# Patient Record
Sex: Male | Born: 1950 | Race: White | Hispanic: No | Marital: Married | Smoking: Current every day smoker
Health system: Southern US, Community
[De-identification: ages and names within clinical notes are randomized; demographics above are authoritative.]

## PROBLEM LIST (undated history)

## (undated) DIAGNOSIS — G4730 Sleep apnea, unspecified: Secondary | ICD-10-CM

## (undated) HISTORY — PX: COLONOSCOPY: SHX174

---

## 2006-04-04 ENCOUNTER — Ambulatory Visit: Payer: Self-pay | Admitting: Unknown Physician Specialty

## 2009-09-26 ENCOUNTER — Ambulatory Visit: Payer: Self-pay | Admitting: Podiatry

## 2012-06-17 DIAGNOSIS — Z87898 Personal history of other specified conditions: Secondary | ICD-10-CM

## 2012-06-17 HISTORY — DX: Personal history of other specified conditions: Z87.898

## 2014-06-17 DIAGNOSIS — N401 Enlarged prostate with lower urinary tract symptoms: Secondary | ICD-10-CM

## 2014-06-17 HISTORY — DX: Benign prostatic hyperplasia with lower urinary tract symptoms: N40.1

## 2016-05-30 ENCOUNTER — Other Ambulatory Visit: Payer: Self-pay | Admitting: Unknown Physician Specialty

## 2016-05-30 DIAGNOSIS — Q438 Other specified congenital malformations of intestine: Secondary | ICD-10-CM

## 2016-06-13 ENCOUNTER — Ambulatory Visit
Admission: RE | Admit: 2016-06-13 | Discharge: 2016-06-13 | Disposition: A | Discharge status: Home / Self Care | Payer: BC Managed Care – PPO | Source: Ambulatory Visit | Attending: Unknown Physician Specialty | Admitting: Unknown Physician Specialty

## 2016-06-13 DIAGNOSIS — Q438 Other specified congenital malformations of intestine: Secondary | ICD-10-CM

## 2018-11-06 ENCOUNTER — Other Ambulatory Visit: Payer: Self-pay | Admitting: Internal Medicine

## 2018-11-06 DIAGNOSIS — G9349 Other encephalopathy: Secondary | ICD-10-CM

## 2018-11-19 ENCOUNTER — Ambulatory Visit
Admission: RE | Admit: 2018-11-19 | Discharge: 2018-11-19 | Disposition: A | Discharge status: Home / Self Care | Payer: BC Managed Care – PPO | Source: Ambulatory Visit | Attending: Internal Medicine | Admitting: Internal Medicine

## 2018-11-19 ENCOUNTER — Other Ambulatory Visit: Payer: Self-pay

## 2018-11-19 DIAGNOSIS — G9349 Other encephalopathy: Secondary | ICD-10-CM | POA: Insufficient documentation

## 2018-11-19 MED ORDER — GADOBUTROL 1 MMOL/ML IV SOLN
7.0000 mL | Freq: Once | INTRAVENOUS | Status: AC | PRN
  Administered 2018-11-19: 7 mL via INTRAVENOUS

## 2019-04-18 DIAGNOSIS — G3184 Mild cognitive impairment, so stated: Secondary | ICD-10-CM

## 2019-04-18 HISTORY — DX: Mild cognitive impairment of uncertain or unknown etiology: G31.84

## 2020-07-26 ENCOUNTER — Other Ambulatory Visit: Payer: Self-pay | Admitting: Orthopedic Surgery

## 2020-07-26 DIAGNOSIS — M25311 Other instability, right shoulder: Secondary | ICD-10-CM

## 2020-07-26 DIAGNOSIS — G8929 Other chronic pain: Secondary | ICD-10-CM

## 2020-07-26 DIAGNOSIS — S46001A Unspecified injury of muscle(s) and tendon(s) of the rotator cuff of right shoulder, initial encounter: Secondary | ICD-10-CM

## 2020-08-02 ENCOUNTER — Ambulatory Visit
Admission: RE | Admit: 2020-08-02 | Discharge: 2020-08-02 | Disposition: A | Discharge status: Home / Self Care | Payer: BC Managed Care – PPO | Source: Ambulatory Visit | Attending: Orthopedic Surgery | Admitting: Orthopedic Surgery

## 2020-08-02 ENCOUNTER — Other Ambulatory Visit: Payer: Self-pay

## 2020-08-02 DIAGNOSIS — M25511 Pain in right shoulder: Secondary | ICD-10-CM | POA: Insufficient documentation

## 2020-08-02 DIAGNOSIS — G8929 Other chronic pain: Secondary | ICD-10-CM | POA: Diagnosis present

## 2020-08-02 DIAGNOSIS — S46001A Unspecified injury of muscle(s) and tendon(s) of the rotator cuff of right shoulder, initial encounter: Secondary | ICD-10-CM | POA: Diagnosis present

## 2020-08-02 DIAGNOSIS — M25311 Other instability, right shoulder: Secondary | ICD-10-CM | POA: Insufficient documentation

## 2020-08-16 ENCOUNTER — Other Ambulatory Visit: Payer: Self-pay | Admitting: Orthopedic Surgery

## 2020-08-23 ENCOUNTER — Other Ambulatory Visit: Payer: Self-pay

## 2020-08-23 ENCOUNTER — Encounter
Admission: RE | Admit: 2020-08-23 | Discharge: 2020-08-23 | Disposition: A | Discharge status: Home / Self Care | Payer: BC Managed Care – PPO | Source: Ambulatory Visit | Attending: Orthopedic Surgery | Admitting: Orthopedic Surgery

## 2020-08-23 HISTORY — DX: Sleep apnea, unspecified: G47.30

## 2020-08-23 NOTE — Patient Instructions (Addendum)
Your procedure is scheduled on:  Monday, March 14 Report to the Registration Desk on the 1st floor of the [facility] To find out your arrival time, please call [phone] between 1PM - 3PM on: Friday, March 11  REMEMBER: Instructions that are not followed completely may result in serious medical risk, up to and including death; or upon the discretion of your surgeon and anesthesiologist your surgery may need to be rescheduled.  Do not eat food after midnight the night before surgery.  No gum chewing, lozengers or hard candies.  You may however, drink CLEAR liquids up to 2 hours before you are scheduled to arrive for your surgery. Do not drink anything within 2 hours of your scheduled arrival time.  Clear liquids include: - water  - apple juice without pulp - gatorade (not RED, PURPLE, OR BLUE) - black coffee or tea (Do NOT add milk or creamers to the coffee or tea) Do NOT drink anything that is not on this list.  In addition, your doctor has ordered for you to drink the provided  Ensure Pre-Surgery Clear Carbohydrate Drink  Drinking this carbohydrate drink up to two hours before surgery helps to reduce insulin resistance and improve patient outcomes. Please complete drinking 2 hours prior to scheduled arrival time.  TAKE THESE MEDICATIONS THE MORNING OF SURGERY WITH A SIP OF WATER:  1.  Finasteride 2.  Sertraline (Zoloft) 3.  Tramadol if needed for pain  One week prior to surgery: starting now, March 9 Stop MELOXICAM, Anti-inflammatories (NSAIDS) such as Advil, Aleve, Ibuprofen, Motrin, Naproxen, Naprosyn and Aspirin based products such as Excedrin, Goodys Powder, BC Powder. Stop ANY OVER THE COUNTER supplements until after surgery. Fish oil  No Alcohol for 24 hours before or after surgery.  No Smoking including e-cigarettes for 24 hours prior to surgery.  No chewable tobacco products for at least 6 hours prior to surgery.  No nicotine patches on the day of surgery.  Do  not use any "recreational" drugs for at least a week prior to your surgery.  Please be advised that the combination of cocaine and anesthesia may have negative outcomes, up to and including death. If you test positive for cocaine, your surgery will be cancelled.  On the morning of surgery brush your teeth with toothpaste and water, you may rinse your mouth with mouthwash if you wish. Do not swallow any toothpaste or mouthwash.  Do not wear jewelry, make-up, hairpins, clips or nail polish.  Do not wear lotions, powders, or perfumes.   Do not shave body from the neck down 48 hours prior to surgery just in case you cut yourself which could leave a site for infection.  Also, freshly shaved skin may become irritated if using the CHG soap.  Contact lenses, hearing aids and dentures may not be worn into surgery.  Do not bring valuables to the hospital. [facility] is not responsible for any missing/lost belongings or valuables.   Use CHG Soap or wipes as directed on instruction sheet.  Bring your C-PAP to the hospital with you in case you may have to spend the night.   Notify your doctor if there is any change in your medical condition (cold, fever, infection).  Wear comfortable clothing (specific to your surgery type) to the hospital.  Plan for stool softeners for home use; pain medications have a tendency to cause constipation. You can also help prevent constipation by eating foods high in fiber such as fruits and vegetables and drinking plenty  of fluids as your diet allows.  After surgery, you can help prevent lung complications by doing breathing exercises.  Take deep breaths and cough every 1-2 hours. Your doctor may order a device called an Incentive Spirometer to help you take deep breaths.  If you are being discharged the day of surgery, you will not be allowed to drive home. You will need a responsible adult (18 years or older) to drive you home and stay with you that night.   If  you are taking public transportation, you will need to have a responsible adult (18 years or older) with you. Please confirm with your physician that it is acceptable to use public transportation.   Please call the Pre-admissions Testing Dept. at [phone] if you have any questions about these instructions.  Surgery Visitation Policy:  Patients undergoing a surgery or procedure may have one family member or support person with them as long as that person is not COVID-19 positive or experiencing its symptoms.  That person may remain in the waiting area during the procedure.

## 2020-08-24 ENCOUNTER — Other Ambulatory Visit: Payer: Self-pay

## 2020-08-24 ENCOUNTER — Encounter
Admission: RE | Admit: 2020-08-24 | Discharge: 2020-08-24 | Disposition: A | Discharge status: Home / Self Care | Payer: BC Managed Care – PPO | Source: Ambulatory Visit | Attending: Orthopedic Surgery | Admitting: Orthopedic Surgery

## 2020-08-24 DIAGNOSIS — R54 Age-related physical debility: Secondary | ICD-10-CM | POA: Insufficient documentation

## 2020-08-24 DIAGNOSIS — Z01810 Encounter for preprocedural cardiovascular examination: Secondary | ICD-10-CM | POA: Insufficient documentation

## 2020-08-24 NOTE — Progress Notes (Signed)
EKG performed today, reviewed by Dr. Noralyn Pick (anesthesia). No previous EKG's on file to compare. Patient having no cardiac history and no cardiac symptoms presently. Ok to proceed with upcoming surgery next week.

## 2020-08-25 ENCOUNTER — Other Ambulatory Visit: Payer: Self-pay

## 2020-08-25 ENCOUNTER — Other Ambulatory Visit
Admission: RE | Admit: 2020-08-25 | Discharge: 2020-08-25 | Disposition: A | Discharge status: Home / Self Care | Payer: BC Managed Care – PPO | Source: Ambulatory Visit | Attending: Orthopedic Surgery | Admitting: Orthopedic Surgery

## 2020-08-25 DIAGNOSIS — Z20822 Contact with and (suspected) exposure to covid-19: Secondary | ICD-10-CM | POA: Insufficient documentation

## 2020-08-25 DIAGNOSIS — M75122 Complete rotator cuff tear or rupture of left shoulder, not specified as traumatic: Secondary | ICD-10-CM | POA: Diagnosis not present

## 2020-08-25 DIAGNOSIS — M75102 Unspecified rotator cuff tear or rupture of left shoulder, not specified as traumatic: Secondary | ICD-10-CM | POA: Diagnosis present

## 2020-08-25 DIAGNOSIS — M25812 Other specified joint disorders, left shoulder: Secondary | ICD-10-CM | POA: Diagnosis not present

## 2020-08-25 DIAGNOSIS — Z01812 Encounter for preprocedural laboratory examination: Secondary | ICD-10-CM | POA: Insufficient documentation

## 2020-08-25 LAB — SARS CORONAVIRUS 2 (TAT 6-24 HRS): SARS Coronavirus 2: NEGATIVE

## 2020-08-28 ENCOUNTER — Ambulatory Visit: Payer: BC Managed Care – PPO | Admitting: Anesthesiology

## 2020-08-28 ENCOUNTER — Encounter: Payer: Self-pay | Admitting: Orthopedic Surgery

## 2020-08-28 ENCOUNTER — Encounter
Admission: RE | Disposition: A | Discharge status: Home / Self Care | Payer: Self-pay | Source: Home / Self Care | Attending: Orthopedic Surgery

## 2020-08-28 ENCOUNTER — Ambulatory Visit
Admission: RE | Admit: 2020-08-28 | Discharge: 2020-08-28 | Disposition: A | Discharge status: Home / Self Care | Payer: BC Managed Care – PPO | Attending: Orthopedic Surgery | Admitting: Orthopedic Surgery

## 2020-08-28 ENCOUNTER — Ambulatory Visit: Payer: BC Managed Care – PPO

## 2020-08-28 DIAGNOSIS — Z20822 Contact with and (suspected) exposure to covid-19: Secondary | ICD-10-CM | POA: Insufficient documentation

## 2020-08-28 DIAGNOSIS — M75122 Complete rotator cuff tear or rupture of left shoulder, not specified as traumatic: Secondary | ICD-10-CM | POA: Insufficient documentation

## 2020-08-28 DIAGNOSIS — Z419 Encounter for procedure for purposes other than remedying health state, unspecified: Secondary | ICD-10-CM

## 2020-08-28 DIAGNOSIS — M25812 Other specified joint disorders, left shoulder: Secondary | ICD-10-CM | POA: Insufficient documentation

## 2020-08-28 DIAGNOSIS — M25519 Pain in unspecified shoulder: Secondary | ICD-10-CM

## 2020-08-28 HISTORY — PX: SHOULDER ARTHROSCOPY WITH SUBACROMIAL DECOMPRESSION AND OPEN ROTATOR C: SHX5688

## 2020-08-28 SURGERY — SHOULDER ARTHROSCOPY WITH SUBACROMIAL DECOMPRESSION AND OPEN ROTATOR CUFF REPAIR, OPEN BICEPS TENDON REPAIR
Anesthesia: General | Laterality: Right

## 2020-08-28 MED ORDER — ORAL CARE MOUTH RINSE: 15.0000 mL | Freq: Once | OROMUCOSAL | Status: AC

## 2020-08-28 MED ORDER — BUPIVACAINE HCL (PF) 0.5 % IJ SOLN
INTRAMUSCULAR | Status: DC | PRN
  Administered 2020-08-28: 10 mL via PERINEURAL

## 2020-08-28 MED ORDER — ACETAMINOPHEN 10 MG/ML IV SOLN
INTRAVENOUS | Status: DC | PRN
  Administered 2020-08-28: 1000 mg via INTRAVENOUS

## 2020-08-28 MED ORDER — BUPIVACAINE HCL (PF) 0.5 % IJ SOLN
INTRAMUSCULAR | Status: AC
  Filled 2020-08-28: qty 10

## 2020-08-28 MED ORDER — DEXAMETHASONE SODIUM PHOSPHATE 10 MG/ML IJ SOLN
INTRAMUSCULAR | Status: DC | PRN
  Administered 2020-08-28: 5 mg via INTRAVENOUS

## 2020-08-28 MED ORDER — ACETAMINOPHEN 10 MG/ML IV SOLN
INTRAVENOUS | Status: AC
  Filled 2020-08-28: qty 100

## 2020-08-28 MED ORDER — LIDOCAINE HCL (CARDIAC) PF 100 MG/5ML IV SOSY
PREFILLED_SYRINGE | INTRAVENOUS | Status: DC | PRN
  Administered 2020-08-28: 100 mg via INTRAVENOUS

## 2020-08-28 MED ORDER — BUPIVACAINE LIPOSOME 1.3 % IJ SUSP
INTRAMUSCULAR | Status: DC | PRN
  Administered 2020-08-28: 20 mL via PERINEURAL

## 2020-08-28 MED ORDER — CHLORHEXIDINE GLUCONATE 0.12 % MT SOLN
OROMUCOSAL | Status: AC
  Filled 2020-08-28: qty 15

## 2020-08-28 MED ORDER — FENTANYL CITRATE (PF) 100 MCG/2ML IJ SOLN
25.0000 ug | INTRAMUSCULAR | Status: DC | PRN
Start: 2020-08-28 — End: 2020-08-28

## 2020-08-28 MED ORDER — FAMOTIDINE 20 MG PO TABS
20.0000 mg | ORAL_TABLET | Freq: Once | ORAL | Status: AC
  Administered 2020-08-28: 20 mg via ORAL

## 2020-08-28 MED ORDER — LACTATED RINGERS IV SOLN: INTRAVENOUS | Status: DC

## 2020-08-28 MED ORDER — ASPIRIN EC 325 MG PO TBEC: 325.0000 mg | DELAYED_RELEASE_TABLET | Freq: Every day | ORAL | 0 refills | Status: AC

## 2020-08-28 MED ORDER — LIDOCAINE HCL (PF) 1 % IJ SOLN
INTRAMUSCULAR | Status: DC | PRN
  Administered 2020-08-28: 3 mL via SUBCUTANEOUS

## 2020-08-28 MED ORDER — PROPOFOL 10 MG/ML IV BOLUS
INTRAVENOUS | Status: AC
  Filled 2020-08-28: qty 20

## 2020-08-28 MED ORDER — MIDAZOLAM HCL 2 MG/2ML IJ SOLN: 2.0000 mg | Freq: Once | INTRAMUSCULAR | Status: AC

## 2020-08-28 MED ORDER — LIDOCAINE HCL (PF) 1 % IJ SOLN
INTRAMUSCULAR | Status: AC
  Filled 2020-08-28: qty 5

## 2020-08-28 MED ORDER — FAMOTIDINE 20 MG PO TABS
ORAL_TABLET | ORAL | Status: AC
  Filled 2020-08-28: qty 1

## 2020-08-28 MED ORDER — EPHEDRINE SULFATE 50 MG/ML IJ SOLN
INTRAMUSCULAR | Status: DC | PRN
  Administered 2020-08-28: 10 mg via INTRAVENOUS

## 2020-08-28 MED ORDER — SUGAMMADEX SODIUM 200 MG/2ML IV SOLN
INTRAVENOUS | Status: DC | PRN
  Administered 2020-08-28: 200 mg via INTRAVENOUS

## 2020-08-28 MED ORDER — PROPOFOL 10 MG/ML IV BOLUS
INTRAVENOUS | Status: DC | PRN
  Administered 2020-08-28: 140 mg via INTRAVENOUS

## 2020-08-28 MED ORDER — FENTANYL CITRATE (PF) 100 MCG/2ML IJ SOLN
INTRAMUSCULAR | Status: AC
  Filled 2020-08-28: qty 2

## 2020-08-28 MED ORDER — MIDAZOLAM HCL 2 MG/2ML IJ SOLN
INTRAMUSCULAR | Status: AC
  Filled 2020-08-28: qty 2

## 2020-08-28 MED ORDER — ACETAMINOPHEN 500 MG PO TABS: 1000.0000 mg | ORAL_TABLET | Freq: Three times a day (TID) | ORAL | 2 refills | Status: AC

## 2020-08-28 MED ORDER — ROCURONIUM BROMIDE 100 MG/10ML IV SOLN
INTRAVENOUS | Status: DC | PRN
  Administered 2020-08-28: 10 mg via INTRAVENOUS
  Administered 2020-08-28: 40 mg via INTRAVENOUS
  Administered 2020-08-28: 10 mg via INTRAVENOUS

## 2020-08-28 MED ORDER — CEFAZOLIN SODIUM-DEXTROSE 2-4 GM/100ML-% IV SOLN
2.0000 g | INTRAVENOUS | Status: AC
  Administered 2020-08-28: 2 g via INTRAVENOUS
  Filled 2020-08-28 (×2): qty 100

## 2020-08-28 MED ORDER — CHLORHEXIDINE GLUCONATE 0.12 % MT SOLN
15.0000 mL | Freq: Once | OROMUCOSAL | Status: AC
  Administered 2020-08-28: 15 mL via OROMUCOSAL

## 2020-08-28 MED ORDER — ONDANSETRON HCL 4 MG/2ML IJ SOLN
4.0000 mg | Freq: Once | INTRAMUSCULAR | Status: AC | PRN
  Administered 2020-08-28: 4 mg via INTRAVENOUS

## 2020-08-28 MED ORDER — EPHEDRINE 5 MG/ML INJ
INTRAVENOUS | Status: AC
  Filled 2020-08-28: qty 10

## 2020-08-28 MED ORDER — FENTANYL CITRATE (PF) 100 MCG/2ML IJ SOLN
INTRAMUSCULAR | Status: DC | PRN
  Administered 2020-08-28: 50 ug via INTRAVENOUS
  Administered 2020-08-28: 25 ug via INTRAVENOUS

## 2020-08-28 MED ORDER — ONDANSETRON HCL 4 MG/2ML IJ SOLN
INTRAMUSCULAR | Status: DC | PRN
  Administered 2020-08-28: 4 mg via INTRAVENOUS

## 2020-08-28 MED ORDER — MIDAZOLAM HCL 2 MG/2ML IJ SOLN
INTRAMUSCULAR | Status: AC
  Administered 2020-08-28: 1.5 mg via INTRAVENOUS
  Filled 2020-08-28: qty 2

## 2020-08-28 MED ORDER — OXYCODONE HCL 5 MG PO TABS: 5.0000 mg | ORAL_TABLET | ORAL | 0 refills | Status: AC | PRN

## 2020-08-28 MED ORDER — SUCCINYLCHOLINE CHLORIDE 20 MG/ML IJ SOLN
INTRAMUSCULAR | Status: DC | PRN
  Administered 2020-08-28: 120 mg via INTRAVENOUS

## 2020-08-28 MED ORDER — ONDANSETRON 4 MG PO TBDP: 4.0000 mg | ORAL_TABLET | Freq: Three times a day (TID) | ORAL | 0 refills | Status: AC | PRN

## 2020-08-28 MED ORDER — BUPIVACAINE LIPOSOME 1.3 % IJ SUSP
INTRAMUSCULAR | Status: AC
  Filled 2020-08-28: qty 20

## 2020-08-28 SURGICAL SUPPLY — 81 items
ADAPTER IRRIG TUBE 2 SPIKE SOL (ADAPTER) ×4 IMPLANT
ADH SKN CLS APL DERMABOND .7 (GAUZE/BANDAGES/DRESSINGS) ×1
ADPR TBG 2 SPK PMP STRL ASCP (ADAPTER) ×2
ANCH SUT 2 SWLK 19.1 CLS EYLT (Anchor) ×1 IMPLANT
ANCH SUT 2X2.3 TAPE (Anchor) ×1 IMPLANT
ANCHOR 2.3 SP SGL 1.2 XBRAID (Anchor) ×2 IMPLANT
ANCHOR SWIVELOCK BIO 4.75X19.1 (Anchor) ×2 IMPLANT
APL PRP STRL LF DISP 70% ISPRP (MISCELLANEOUS) ×1
BNDG ADH 2 X3.75 FABRIC TAN LF (GAUZE/BANDAGES/DRESSINGS) ×2 IMPLANT
BNDG ADH XL 3.75X2 STRCH LF (GAUZE/BANDAGES/DRESSINGS) ×1
BUR BR 5.5 12 FLUTE (BURR) ×2 IMPLANT
BUR RADIUS 4.0X18.5 (BURR) ×2 IMPLANT
CANNULA PART THRD DISP 5.75X7 (CANNULA) IMPLANT
CANNULA PARTIAL THREAD 2X7 (CANNULA) IMPLANT
CANNULA TWIST IN 8.25X9CM (CANNULA) IMPLANT
CHLORAPREP W/TINT 26 (MISCELLANEOUS) ×2 IMPLANT
COOLER POLAR GLACIER W/PUMP (MISCELLANEOUS) ×2 IMPLANT
COVER WAND RF STERILE (DRAPES) ×2 IMPLANT
DERMABOND ADVANCED (GAUZE/BANDAGES/DRESSINGS) ×1
DERMABOND ADVANCED .7 DNX12 (GAUZE/BANDAGES/DRESSINGS) ×1 IMPLANT
DRAPE 3/4 80X56 (DRAPES) ×2 IMPLANT
DRAPE IMP U-DRAPE 54X76 (DRAPES) ×4 IMPLANT
DRAPE INCISE IOBAN 66X45 STRL (DRAPES) ×2 IMPLANT
DRAPE U-SHAPE 47X51 STRL (DRAPES) ×4 IMPLANT
DRSG PAD ABDOMINAL 8X10 ST (GAUZE/BANDAGES/DRESSINGS) ×2 IMPLANT
DRSG TEGADERM 4X4.75 (GAUZE/BANDAGES/DRESSINGS) ×2 IMPLANT
ELECT REM PT RETURN 9FT ADLT (ELECTROSURGICAL) ×2
ELECTRODE REM PT RTRN 9FT ADLT (ELECTROSURGICAL) ×1 IMPLANT
GAUZE SPONGE 4X4 12PLY STRL (GAUZE/BANDAGES/DRESSINGS) ×2 IMPLANT
GAUZE XEROFORM 1X8 LF (GAUZE/BANDAGES/DRESSINGS) ×2 IMPLANT
GLOVE SRG 8 PF TXTR STRL LF DI (GLOVE) ×2 IMPLANT
GLOVE SURG ENC MOIS LTX SZ7.5 (GLOVE) ×2 IMPLANT
GLOVE SURG ORTHO LTX SZ8 (GLOVE) ×2 IMPLANT
GLOVE SURG SYN 8.0 (GLOVE) ×2 IMPLANT
GLOVE SURG UNDER POLY LF SZ8 (GLOVE) ×4
GOWN STRL REUS W/ TWL LRG LVL3 (GOWN DISPOSABLE) ×2 IMPLANT
GOWN STRL REUS W/TWL LRG LVL3 (GOWN DISPOSABLE) ×4
GOWN STRL REUS W/TWL XL LVL4 (GOWN DISPOSABLE) ×2 IMPLANT
IV LACTATED RINGER IRRG 3000ML (IV SOLUTION) ×8
IV LR IRRIG 3000ML ARTHROMATIC (IV SOLUTION) ×4 IMPLANT
KIT CORKSCREW KNTLS 3.9 S/T/P (INSTRUMENTS) IMPLANT
KIT STABILIZATION SHOULDER (MISCELLANEOUS) ×2 IMPLANT
KIT SUTURETAK 3.0 INSERT PERC (KITS) IMPLANT
KIT TURNOVER KIT A (KITS) ×2 IMPLANT
MANIFOLD NEPTUNE II (INSTRUMENTS) ×4 IMPLANT
MASK FACE SPIDER DISP (MASK) ×2 IMPLANT
MAT ABSORB  FLUID 56X50 GRAY (MISCELLANEOUS) ×2
MAT ABSORB FLUID 56X50 GRAY (MISCELLANEOUS) ×2 IMPLANT
NDL MAYO CATGUT SZ5 (NEEDLE)
NDL SAFETY ECLIPSE 18X1.5 (NEEDLE) ×1 IMPLANT
NDL SUT 5 .5 CRC TPR PNT MAYO (NEEDLE) IMPLANT
NEEDLE HYPO 18GX1.5 SHARP (NEEDLE) ×2
NEEDLE SCORPION MULTI FIRE (NEEDLE) IMPLANT
PACK ARTHROSCOPY SHOULDER (MISCELLANEOUS) ×2 IMPLANT
PAD ARMBOARD 7.5X6 YLW CONV (MISCELLANEOUS) ×2 IMPLANT
PAD WRAPON POLAR SHDR XLG (MISCELLANEOUS) ×1 IMPLANT
PASSER SUT FIRSTPASS SELF (INSTRUMENTS) ×2 IMPLANT
PASSER SUT SWIFTSTITCH HIP CRT (INSTRUMENTS) ×2 IMPLANT
PENCIL SMOKE EVACUATOR (MISCELLANEOUS) ×2 IMPLANT
SET TUBE SUCT SHAVER OUTFL 24K (TUBING) ×2 IMPLANT
SET TUBE TIP INTRA-ARTICULAR (MISCELLANEOUS) ×2 IMPLANT
SLING ULTRA II M (MISCELLANEOUS) ×2 IMPLANT
SPACER SUBACROMIAL INSPACE MED (Spacer) ×2 IMPLANT
STAPLER SKIN PROX 35W (STAPLE) IMPLANT
STRAP SAFETY 5IN WIDE (MISCELLANEOUS) ×2 IMPLANT
SUT ETHILON 3-0 (SUTURE) ×2 IMPLANT
SUT LASSO 90 DEG CVD (SUTURE) IMPLANT
SUT LASSO 90 DEG SD STR (SUTURE) IMPLANT
SUT MNCRL 4-0 (SUTURE)
SUT MNCRL 4-0 27XMFL (SUTURE)
SUT PROLENE 0 CT 2 (SUTURE) IMPLANT
SUT VIC AB 0 CT1 36 (SUTURE) IMPLANT
SUT VIC AB 2-0 CT2 27 (SUTURE) IMPLANT
SUTURE MNCRL 4-0 27XMF (SUTURE) IMPLANT
SYSTEM FBRTK BICEPS 1.9 DRILL (Anchor) ×2 IMPLANT
TAPE CLOTH 3X10 WHT NS LF (GAUZE/BANDAGES/DRESSINGS) ×2 IMPLANT
TAPE MICROFOAM 4IN (TAPE) ×2 IMPLANT
TUBING ARTHRO INFLOW-ONLY STRL (TUBING) ×2 IMPLANT
TUBING CONNECTING 10 (TUBING) IMPLANT
WAND WEREWOLF FLOW 90D (MISCELLANEOUS) ×2 IMPLANT
WRAPON POLAR PAD SHDR XLG (MISCELLANEOUS) ×2

## 2020-08-28 NOTE — Anesthesia Procedure Notes (Signed)
Anesthesia Regional Block: Interscalene brachial plexus block   Pre-Anesthetic Checklist: ,, timeout performed, Correct Patient, Correct Site, Correct Laterality, Correct Procedure, Correct Position, site marked, Risks and benefits discussed,  Surgical consent,  Pre-op evaluation,  At surgeon's request and post-op pain management  Laterality: Lower and Right  Prep: chloraprep       Needles:  Injection technique: Single-shot  Needle Type: Echogenic Needle     Needle Length: 9cm  Needle Gauge: 21     Additional Needles:   Procedures:,,,, ultrasound used (permanent image in chart),,,,  Narrative:  End time: 08/28/2020 1:45 PM Injection made incrementally with aspirations every 5 mL.  Performed by: Personally  Anesthesiologist: Piscitello, Cleda Mccreedy, MD  Additional Notes: Patient consented for risk and benefits of nerve block including but not limited to nerve damage, failed block, bleeding and infection.  Patient voiced understanding.  Functioning IV was confirmed and monitors were applied.  Timeout done prior to procedure and prior to any sedation being given to the patient.  Patient confirmed procedure site prior to any sedation given to the patient.  A 57mm 22ga Stimuplex needle was used. Sterile prep,hand hygiene and sterile gloves were used.  Minimal sedation used for procedure.  No paresthesia endorsed by patient during the procedure.  Negative aspiration and negative test dose prior to incremental administration of local anesthetic. The patient tolerated the procedure well with no immediate complications.

## 2020-08-28 NOTE — Anesthesia Procedure Notes (Signed)
Procedure Name: Intubation Performed by: Fredderick Phenix, CRNA Pre-anesthesia Checklist: Patient identified, Emergency Drugs available, Suction available and Patient being monitored Patient Re-evaluated:Patient Re-evaluated prior to induction Oxygen Delivery Method: Circle system utilized Preoxygenation: Pre-oxygenation with 100% oxygen Induction Type: IV induction Ventilation: Mask ventilation without difficulty Laryngoscope Size: Mac and 4 Grade View: Grade II Tube type: Oral Tube size: 7.0 mm Number of attempts: 1 Airway Equipment and Method: Stylet,  Oral airway and Bougie stylet Placement Confirmation: ETT inserted through vocal cords under direct vision,  positive ETCO2 and breath sounds checked- equal and bilateral Secured at: 23 cm Tube secured with: Tape Dental Injury: Teeth and Oropharynx as per pre-operative assessment

## 2020-08-28 NOTE — Discharge Instructions (Signed)
Post-Op Instructions - Rotator Cuff Repair ° °1. Bracing: You will wear a shoulder immobilizer or sling for 6 weeks.  ° °2. Driving: No driving for 3 weeks post-op. When driving, do not wear the immobilizer. Ideally, we recommend no driving for 6 weeks while sling is in place as one arm will be immobilized.  ° °3. Activity: No active lifting for 2 months. Wrist, hand, and elbow motion only. Avoid lifting the upper arm away from the body except for hygiene. You are permitted to bend and straighten the elbow passively only (no active elbow motion). You may use your hand and wrist for typing, writing, and managing utensils (cutting food). Do not lift more than a coffee cup for 8 weeks.  When sleeping or resting, inclined positions (recliner chair or wedge pillow) and a pillow under the forearm for support may provide better comfort for up to 4 weeks.  Avoid long distance travel for 4 weeks. ° °Return to normal activities after rotator cuff repair repair normally takes 6 months on average. If rehab goes very well, may be able to do most activities at 4 months, except overhead or contact sports. ° °4. Physical Therapy: Begins 3-4 days after surgery, and proceed 1 time per week for the first 6 weeks, then 1-2 times per week from weeks 6-20 post-op. ° °5. Medications:  °- You will be provided a prescription for narcotic pain medicine. After surgery, take 1-2 narcotic tablets every 4 hours if needed for severe pain.  °- A prescription for anti-nausea medication will be provided in case the narcotic medicine causes nausea - take 1 tablet every 6 hours only if nauseated.   °- Take tylenol 1000 mg (2 Extra Strength tablets or 3 regular strength) every 8 hours for pain.  May decrease or stop tylenol 5 days after surgery if you are having minimal pain. °- Take ASA 325mg/day x 2 weeks to help prevent DVTs/PEs (blood clots).  °- DO NOT take ANY nonsteroidal anti-inflammatory pain medications (Advil, Motrin, Ibuprofen, Aleve,  Naproxen, or Naprosyn). These medicines can inhibit healing of your shoulder repair.  ° ° °If you are taking prescription medication for anxiety, depression, insomnia, muscle spasm, chronic pain, or for attention deficit disorder, you are advised that you are at a higher risk of adverse effects with use of narcotics post-op, including narcotic addiction/dependence, depressed breathing, death. °If you use non-prescribed substances: alcohol, marijuana, cocaine, heroin, methamphetamines, etc., you are at a higher risk of adverse effects with use of narcotics post-op, including narcotic addiction/dependence, depressed breathing, death. °You are advised that taking > 50 morphine milligram equivalents (MME) of narcotic pain medication per day results in twice the risk of overdose or death. For your prescription provided: oxycodone 5 mg - taking more than 6 tablets per day would result in > 50 morphine milligram equivalents (MME) of narcotic pain medication. °Be advised that we will prescribe narcotics short-term, for acute post-operative pain only - 3 weeks for major operations such as shoulder repair/reconstruction surgeries.  ° ° ° °6. Post-Op Appointment: ° °Your first post-op appointment will be 10-14 days post-op. ° °7. Work or School: For most, but not all procedures, we advise staying out of work or school for at least 1 to 2 weeks in order to recover from the stress of surgery and to allow time for healing.  ° °If you need a work or school note this can be provided.  ° °8. Smoking: If you are a smoker, you need to refrain from   smoking in the postoperative period. The nicotine in cigarettes will inhibit healing of your shoulder repair and decrease the chance of successful repair. Similarly, nicotine containing products (gum, patches) should be avoided.  ° °Post-operative Brace: °Apply and remove the brace you received as you were instructed to at the time of fitting and as described in detail as the brace’s  instructions for use indicate.  Wear the brace for the period of time prescribed by your physician.  The brace can be cleaned with soap and water and allowed to air dry only.  Should the brace result in increased pain, decreased feeling (numbness/tingling), increased swelling or an overall worsening of your medical condition, please contact your doctor immediately.  If an emergency situation occurs as a result of wearing the brace after normal business hours, please dial 911 and seek immediate medical attention.  Let your doctor know if you have any further questions about the brace issued to you. °Refer to the shoulder sling instructions for use if you have any questions regarding the correct fit of your shoulder sling.  °[facility] Customer Care for Troubleshooting: [phone] ° °Video that illustrates how to properly use a shoulder sling: °"Instructions for Proper Use of an Orthopaedic Sling" °https://www.youtube.com/watch?v=AHZpn_Xo45w ° ° °AMBULATORY SURGERY  °DISCHARGE INSTRUCTIONS ° ° °1) The drugs that you were given will stay in your system until tomorrow so for the next 24 hours you should not: ° °A) Drive an automobile °B) Make any legal decisions °C) Drink any alcoholic beverage ° ° °2) You may resume regular meals tomorrow.  Today it is better to start with liquids and gradually work up to solid foods. ° °You may eat anything you prefer, but it is better to start with liquids, then soup and crackers, and gradually work up to solid foods. ° ° °3) Please notify your doctor immediately if you have any unusual bleeding, trouble breathing, redness and pain at the surgery site, drainage, fever, or pain not relieved by medication. ° ° ° °4) Additional Instructions: ° ° ° ° ° ° ° °Please contact your physician with any problems or Same Day Surgery at [phone], Monday through Friday 6 am to 4 pm, or [facility] number at [phone]. ° ° °

## 2020-08-28 NOTE — Anesthesia Preprocedure Evaluation (Signed)
Anesthesia Evaluation  Patient identified by MRN, date of birth, ID band Patient awake    Reviewed: Allergy & Precautions, H&P , NPO status , Patient's Chart, lab work & pertinent test results, reviewed documented beta blocker date and time   Airway Mallampati: II  TM Distance: >3 FB Neck ROM: full    Dental  (+) Teeth Intact   Pulmonary sleep apnea and Continuous Positive Airway Pressure Ventilation , Current Smoker,    Pulmonary exam normal        Cardiovascular Exercise Tolerance: Good negative cardio ROS Normal cardiovascular exam Rhythm:regular Rate:Normal     Neuro/Psych negative neurological ROS  negative psych ROS   GI/Hepatic negative GI ROS, Neg liver ROS,   Endo/Other  negative endocrine ROS  Renal/GU negative Renal ROS  negative genitourinary   Musculoskeletal   Abdominal   Peds  Hematology negative hematology ROS (+)   Anesthesia Other Findings Past Medical History: 2016: Benign non-nodular prostatic hyperplasia with lower urinary  tract symptoms 2014: History of elevated PSA 04/2019: Mild cognitive impairment No date: Sleep apnea Past Surgical History: 2007, 2017: COLONOSCOPY   Reproductive/Obstetrics negative OB ROS                             Anesthesia Physical Anesthesia Plan  ASA: III  Anesthesia Plan: General ETT   Post-op Pain Management:  Regional for Post-op pain   Induction:   PONV Risk Score and Plan: 3  Airway Management Planned:   Additional Equipment:   Intra-op Plan:   Post-operative Plan:   Informed Consent: I have reviewed the patients History and Physical, chart, labs and discussed the procedure including the risks, benefits and alternatives for the proposed anesthesia with the patient or authorized representative who has indicated his/her understanding and acceptance.     Dental Advisory Given  Plan Discussed with:  CRNA  Anesthesia Plan Comments:         Anesthesia Quick Evaluation

## 2020-08-28 NOTE — Transfer of Care (Signed)
Immediate Anesthesia Transfer of Care Note  Patient: Chad Mann  Procedure(s) Performed: Right shoulder arthroscopic  subacromial decompression, mini-openpartial rotator cuff repair  and subacromial balloon spacer placement, , and biceps tenodesis - Dedra Skeens to Assist (Right )  Patient Location: PACU  Anesthesia Type:General  Level of Consciousness: awake and alert   Airway & Oxygen Therapy: Patient Spontanous Breathing and Patient connected to nasal cannula oxygen  Post-op Assessment: Report given to RN and Post -op Vital signs reviewed and stable  Post vital signs: Reviewed and stable  Last Vitals:  Vitals Value Taken Time  BP 125/83 08/28/20 1632  Temp    Pulse 56 08/28/20 1636  Resp 18 08/28/20 1636  SpO2 100 % 08/28/20 1636  Vitals shown include unvalidated device data.  Last Pain:  Vitals:   08/28/20 1119  TempSrc: Temporal  PainSc: 0-No pain         Complications: No complications documented.

## 2020-08-28 NOTE — Op Note (Signed)
SURGERY DATE: 08/28/2020  PRE-OP DIAGNOSIS:  1. Left rotator cuff tear (supraspinatus, infraspinatus) 2. Left proximal biceps tendinopathy 3. Left subacromial impingement  POST-OP DIAGNOSIS: 1. Left rotator cuff tear (supraspinatus, infraspinatus) 2. Left proximal biceps tendinopathy 3. Left subacromial impingement  PROCEDURES:  1. Left mini-open rotator cuff repair (partial infraspinatus and supraspinatus) 2. Left implantation of subacromial biodegradable balloon spacer 3. Left biceps tenodesis 4. Left arthroscopic subacromial decompression 5. Left arthroscopic extensive debridement of shoulder (glenohumeral and subacromial spaces)  SURGEON: Rosealee Albee, MD  ASSISTANT: Sonny Dandy, PA, Melven Sartorius, PA-S  ANESTHESIA: Gen with Exparil interscalene block  ESTIMATED BLOOD LOSS: 10cc  DRAINS:  none  TOTAL IV FLUIDS: per anesthesia   SPECIMENS: none  IMPLANTS:  - [employer]ker InSpace biodegradable balloon spacer - Medium - Arthrex 4.75 mm swivel lock x1 - Iconix SPEED all-suture anchor double loaded x 1 - Arthrex 1.50mm FiberTak anchor   OPERATIVE FINDINGS:  Examination under anesthesia: A careful examination under anesthesia was performed.  Passive range of motion was: FF: 140; ER at side: 50; ER in abduction: 90; IR in abduction: 50.  Anterior load shift: NT.  Posterior load shift: NT.  Sulcus in neutral: NT.  Sulcus in ER: NT.    Intra-operative findings: A thorough arthroscopic examination of the shoulder was performed.  The findings are: 1. Biceps tendon: Significant tendinopathy with thickening and longitudinal split tearing with erythema 2. Superior labrum: Degenerative 3. Posterior labrum and capsule: normal 4. Inferior capsule and inferior recess: normal 5. Glenoid cartilage surface: Focal grade 1-2 changes  6. Supraspinatus attachment: full-thickness tear with retraction to the glenoid 7. Posterior rotator cuff attachment: Complete tear of the  infraspinatus 8. Humeral head articular cartilage: Focal areas of grade 1-2 degenerative changes 9. Rotator interval: significant synovitis 10: Subscapularis tendon: Attachment intact, small area of partial-thickness tearing 11. Anterior labrum: degenerative and synovitic 12. IGHL: significant synovitis around IGHL  OPERATIVE REPORT:   Indications for procedure: Chad Mann is a 70 y.o. male with with an approximately 15-month history of shoulder pain after a fall with exacerbation 3 months ago after moving furniture while at work.  He has had significant difficulty with elevation of the arm and overhead activity as well as significant pain, especially at night. Since initial injury, the patient has failed non-operative management including activity modification, medical management, and exercises.  Clinical exam and MRI were suggestive of a massive rotator cuff tear including supraspinatus and infraspinatus with retraction to the level of the glenoid.  Given the continued symptoms, we decided to proceed with surgical management.  We did discuss that initial plan would be to perform rotator cuff repair if possible.  Otherwise, plan would be for partial repair with subacromial balloon spacer placement.  Placement of subacromial balloon spacer was just recently approved for use and long-term data was incomplete.  Patient was in understanding and given the encouraging short to medium term results, we agreed to proceed.    Procedure in detail:  I identified Chad Mann in the pre-operative holding area.  I marked the operative shoulder with my initials. I reviewed the risks and benefits of the proposed surgical intervention, and the patient (and/or patient's guardian) wished to proceed.  Anesthesia was then performed with an Exparil interscalene block.  The patient was transferred to the operative suite and placed in the beach chair position.    SCDs were placed on the lower extremities. Appropriate  IV antibiotics were administered prior to incision. The operative upper  extremity was then prepped and draped in standard fashion. A time out was performed confirming the correct extremity, correct patient, and correct procedure.   I then created a standard posterior portal with an 11 blade. The glenohumeral joint was easily entered with a blunt trochar and the arthroscope introduced. The findings of diagnostic arthroscopy are described above.  A standard anterior portal was made.  I debrided degenerative tissue including the synovitic tissue about the rotator interval and IGHL as well as the anterior and superior labrum.  The frayed edges of the subscapularis were debrided and attachment was confirmed to be intact.  I then coagulated the inflamed synovium to obtain hemostasis and reduce the risk of post-operative swelling using an Arthrocare radiofrequency device. The biceps tendon was cut at its insertion on the superior labrum with arthroscopic scissors.  Next, the arthroscope was then introduced into the subacromial space. A direct lateral portal was created with an 11-blade after spinal needle localization. An subacromial bursectomy was performed using a combination of the shaver and Arthrocare wand.  The CA ligament was left intact.  The anterior acromial hook was visualized. A 5.32mm barrel burr was used to create a flat anterior and lateral aspect of the acromion, converting it from a Type 2 to a Type 1 acromion. Care was made to keep the deltoid fascia intact.  The rotator cuff cannot be completely mobilized to cover its footprint and so decision was made to perform partial rotator cuff repair with biodegradable balloon spacer placement. The distance from the torn edge of the rotator cuff to the lateral edge of the greater tuberosity was measured and an appropriately sized balloon spacer was selected. This concluded the arthroscopic portion of the procedure.  A longitudinal incision from the  anterolateral acromion ~7cm in length was made overlying the raphe between the anterior and middle heads of the deltoid. The raphe was identified and it was incised. The subacromial space was identified. Any remaining bursa was excised. The rotator cuff tear was identified. It was an L-shaped tear with the long limb of the L anterior.   We then turned our attention to the biceps tenodesis. The arm was externally rotated.  The bicipital groove was identified.  A 15 blade was used to make a cut overlying the biceps tendon, and the tendon was removed using a right angle clamp.  The base of the bicipital groove was identified and cleared of soft tissue.  A FiberTak anchor was placed in the bicipital groove.  The biceps tendon was held at the appropriate amount of tension.  One set of sutures was passed through the biceps anchor with one limb passed in a simple fashion and the second limb passed in a simple plus locking stitch pattern.  This was repeated for the other set of sutures.  This construct allowed for shuttling the biceps tendon down to the bone.  The sutures were tied and cut.  The diseased portion of the proximal biceps was then excised.  The arm was then internally rotated.  The rotator cuff footprint was cleared of soft tissue. A rongeur was used to gently decorticate the rotator cuff footprint to allow for improved healing.  The rotator cuff was mobilized using key elevators both superior and inferior to the tear. The infraspinatus was able to be reduced to its footprint.  The supraspinatus could only be reduced to the articular margin.  Therefore partial repair was performed.  This was done by placing 6 strands of suture tape through the  infraspinatus that were loaded onto a 4.75 mm Arthrex SwiveLock anchor.  This was placed on the lateral aspect of the greater tuberosity with appropriate tension in all 6 strands of suture.  This nicely repaired the infraspinatus to its footprint.  2 margin  convergence sutures were placed at the apex of the residual tear of the supraspinatus/infraspinatus junction.  Knots were tied using a knot pusher and cut appropriately.  This allowed for appropriate mobilization of the supraspinatus to the articular margin.  The articular margin was medialized approximately 2 mm using a rongeur. An Iconix SPEED anchor was placed at the medialized articular margin.  One strand of each of the tapes was placed through the supraspinatus and a knot was tied, bringing the supraspinatus down to the anchor. This construct was stable with external and internal rotation.  Next, was placement of the biodegradable balloon spacer.  The wound was thoroughly irrigated prior to implantation. The implant was held in appropriate position with the lateral edge even with the lateral edge of the greater tuberosity.  It was filled with saline and an appropriate amount of saline was removed allowing for appropriate conformity to the humeral head.  The humeral head was noted to move inferiorly into a more reduced position with inflation of the balloon spacer.  The insertion device was then disconnected and the shoulder was taken through a range of motion.  There was no displacement of the subacromial spacer with motion.  The deltoid split was closed with 0 Vicryl.  The subdermal layer was closed with 2-0 Vicryl.  The skin was closed with 4-0 Monocryl and Dermabond. The portals were closed with 3-0 Nylon. Xeroform was applied to the incisions. A sterile dressing was applied, followed by a Polar Care sleeve and a SlingShot shoulder immobilizer/sling. The patient was awakened from anesthesia without difficulty and was transferred to the PACU in stable condition.   Of note, assistance from a PA was essential to performing the surgery.  PA was present for the entire surgery.  PA assisted with patient positioning, retraction, instrumentation, and wound closure. The surgery would have been more difficult  and had longer operative time without PA assistance.   Additionally, this case had increased complexity compared to standard arthroscopic rotator cuff repair given that partial repair of the infraspinatus was performed in addition to the partial repair of the supraspinatus with use of margin convergence sutures.  Furthermore subacromial balloon spacer placement was performed.  The steps increased surgical time by approximately 30 minutes minutes and increased complexity due to additional preparation and use of additional implants.   COMPLICATIONS: none  DISPOSITION: plan for discharge home after recovery in PACU   POSTOPERATIVE PLAN: Remain in sling (except hygiene and elbow/wrist/hand RoM exercises as instructed by PT) x 6 weeks and NWB for this time. PT to begin 3-4 days after surgery.  Use large rotator cuff repair rehab protocol.  ASA 325mg  daily x 2 weeks for DVT ppx.

## 2020-08-28 NOTE — H&P (Signed)
Paper H&P to be scanned into permanent record. H&P reviewed. No significant changes noted.  

## 2020-08-29 ENCOUNTER — Encounter: Payer: Self-pay | Admitting: Orthopedic Surgery

## 2020-08-29 NOTE — Anesthesia Postprocedure Evaluation (Signed)
Anesthesia Post Note  Patient: Chad Mann  Procedure(s) Performed: Right shoulder arthroscopic  subacromial decompression, mini-openpartial rotator cuff repair  and subacromial balloon spacer placement, , and biceps tenodesis - Dedra Skeens to Assist (Right )  Patient location during evaluation: PACU Anesthesia Type: General Level of consciousness: awake and alert Pain management: pain level controlled Vital Signs Assessment: post-procedure vital signs reviewed and stable Respiratory status: spontaneous breathing, nonlabored ventilation and respiratory function stable Cardiovascular status: blood pressure returned to baseline and stable Postop Assessment: no apparent nausea or vomiting Anesthetic complications: no   No complications documented.   Last Vitals:  Vitals:   08/28/20 1715 08/28/20 1741  BP: (!) 132/95 (!) 154/87  Pulse: (!) 58 66  Resp: 17 16  Temp: (!) 36.2 C   SpO2: 92% 96%    Last Pain:  Vitals:   08/28/20 1741  TempSrc:   PainSc: 0-No pain                 Aurelio Brash Ramsdell

## 2022-10-04 IMAGING — MR MR SHOULDER*R* W/O CM
5 of 6 series · 33 of 40 positions shown · non-contrast
Comparison: None.

CLINICAL DATA: Progressively worsening right shoulder pain since
heavy lifting injury in [REDACTED]. No prior surgery.

EXAM:
MRI OF THE RIGHT SHOULDER WITHOUT CONTRAST
TECHNIQUE: Multiplanar, multisequence MR imaging of the shoulder was performed.
No intravenous contrast was administered.

[Series 5: T2 fat-sat · axial · right · 4.0mm · 0.55mm/px · z∈[-89,+31]mm · 5 of 25 slices shown (1 of 4)]
[im 1/25]
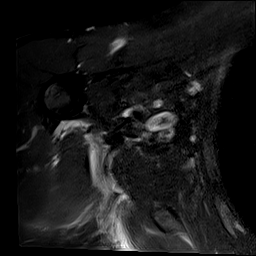
[im 7/25]
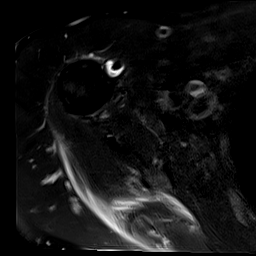
[im 13/25]
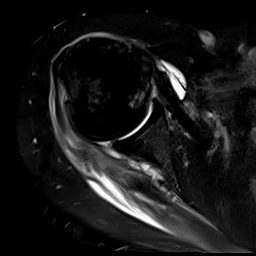
[im 19/25]
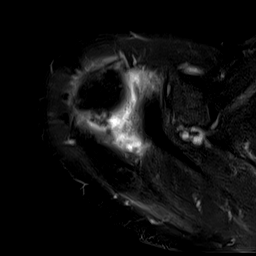
[im 25/25]
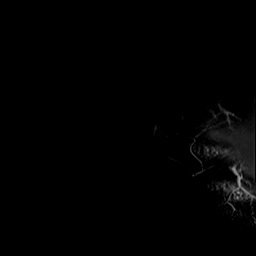

[Series 7: T2 fat-sat · oblique · right · 4.0mm · 0.44mm/px · 7 of 26 slices shown (2 of 4)]
[im 1/26]
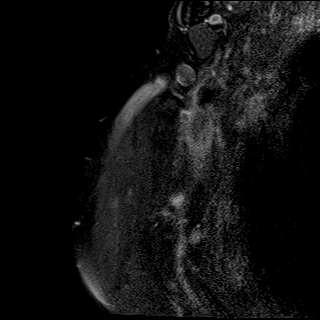
[im 5/26]
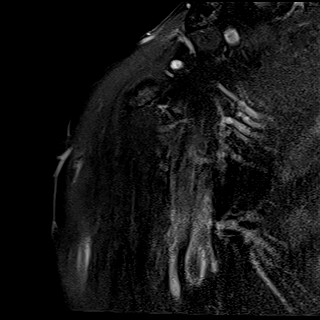
[im 9/26]
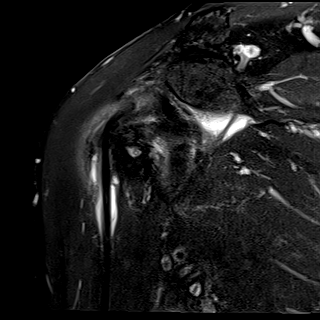
[im 13/26]
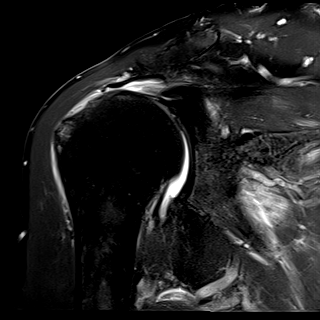
[im 17/26]
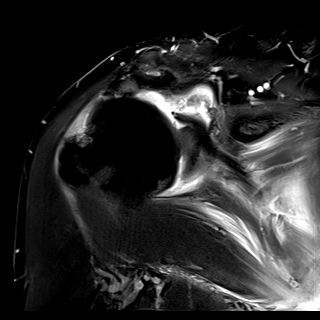
[im 21/26]
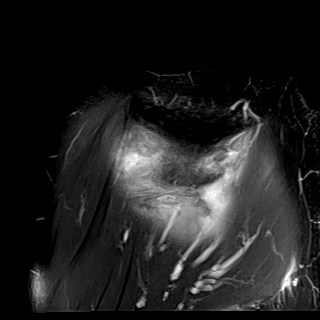
[im 26/26]
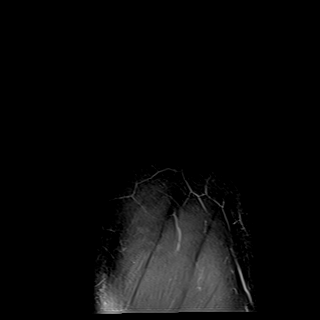

[Series 8: PD · oblique · right · 4.0mm · 0.44mm/px · 7 of 26 slices shown]
[im 1/26]
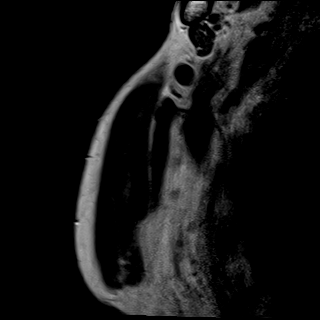
[im 5/26]
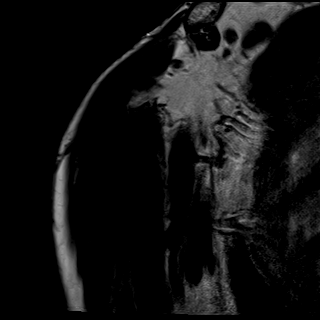
[im 9/26]
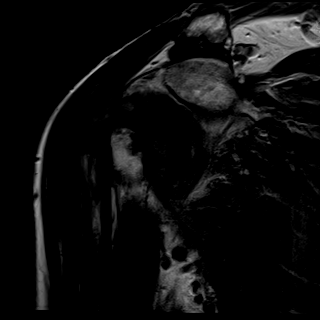
[im 13/26]
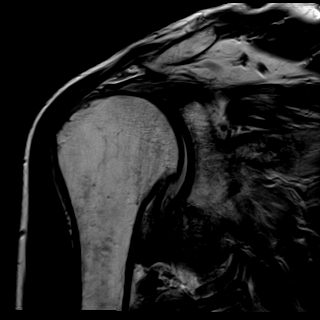
[im 17/26]
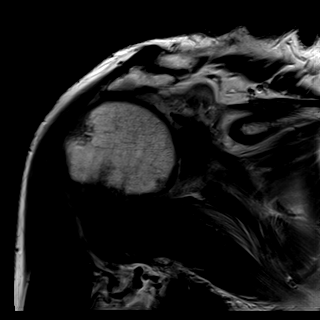
[im 21/26]
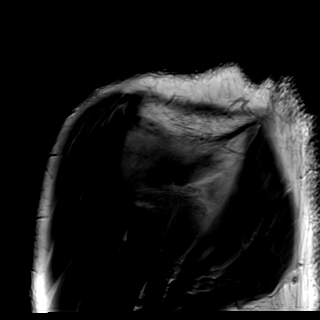
[im 26/26]
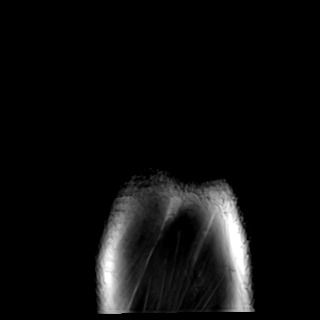

[Series 9: T2 fat-sat · coronal · right · 4.0mm · 0.23mm/px · 7 of 26 slices shown (3 of 4)]
[im 1/26]
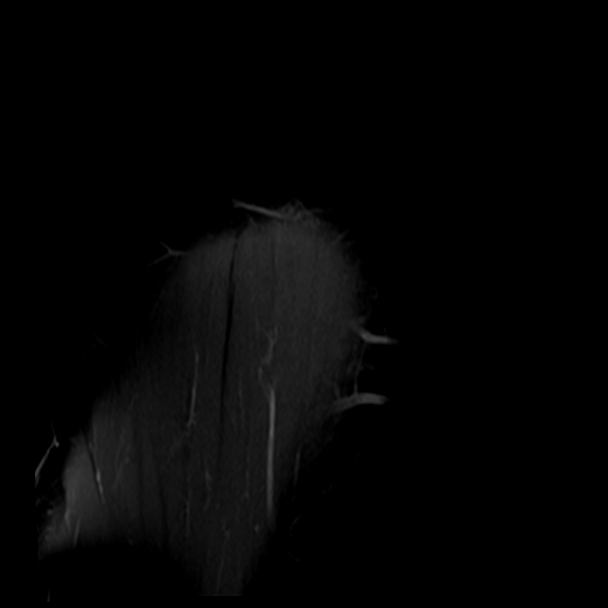
[im 5/26]
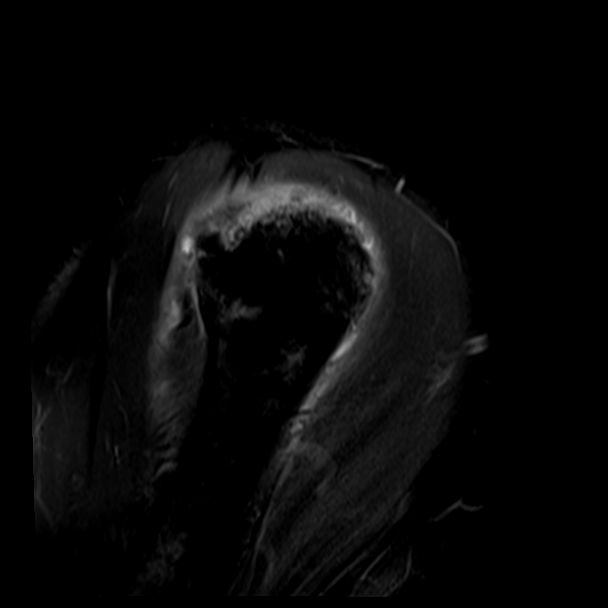
[im 9/26]
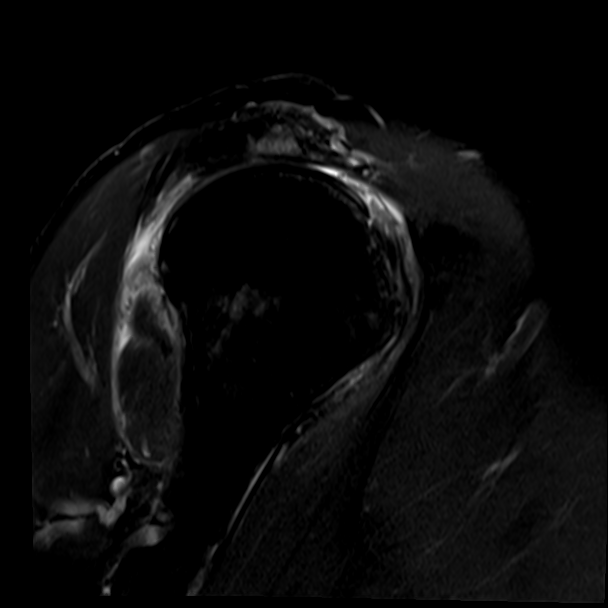
[im 13/26]
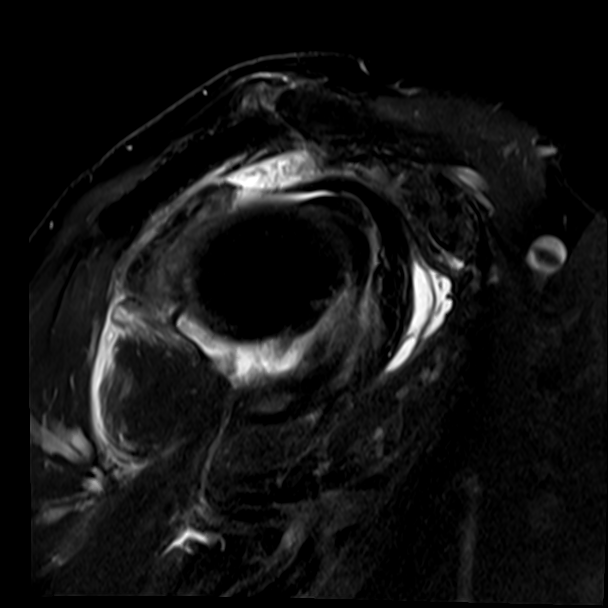
[im 17/26]
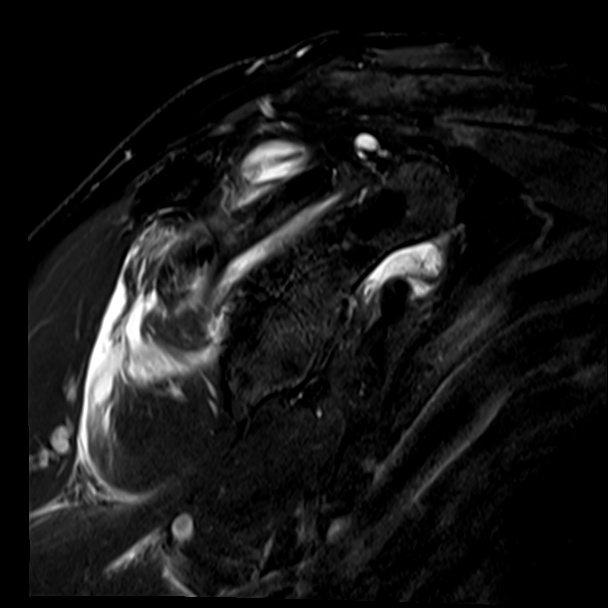
[im 21/26]
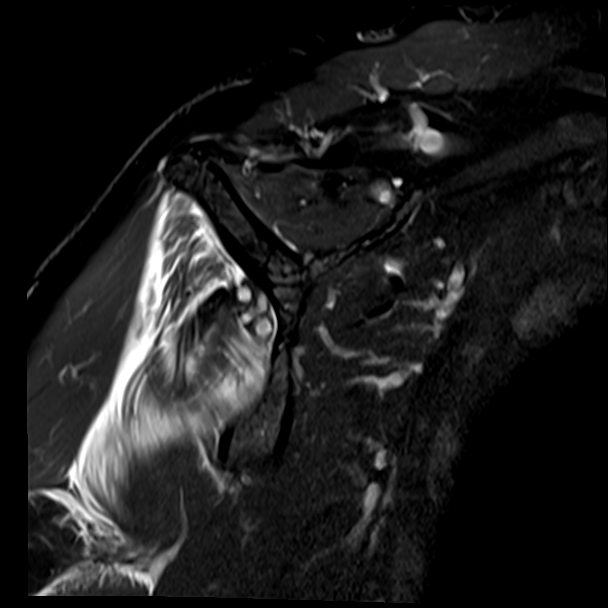
[im 26/26]
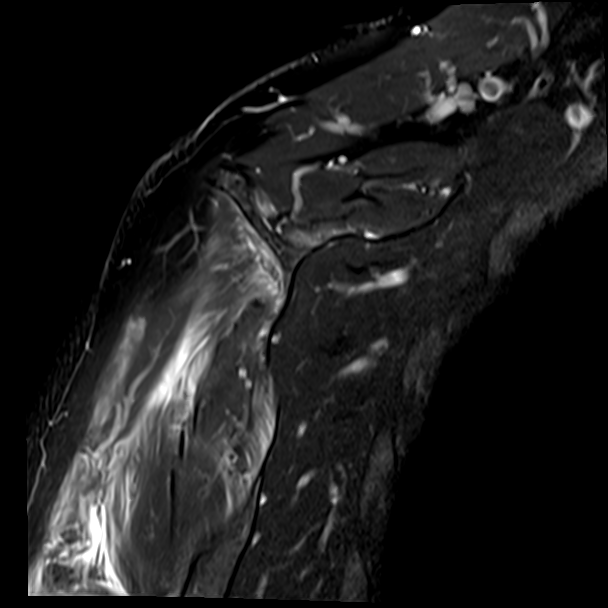

[Series 11: T2 fat-sat · axial · right · 4.0mm · 0.55mm/px · z∈[-89,+31]mm · 7 of 26 slices shown (4 of 4)]
[im 1/26]
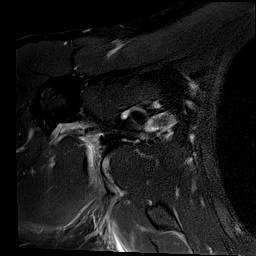
[im 5/26]
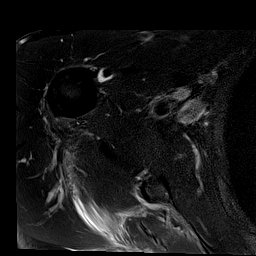
[im 9/26]
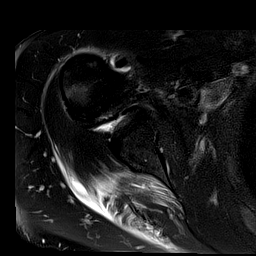
[im 13/26]
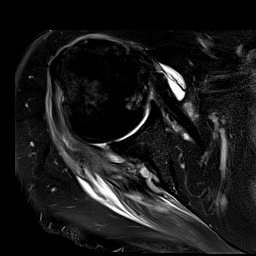
[im 17/26]
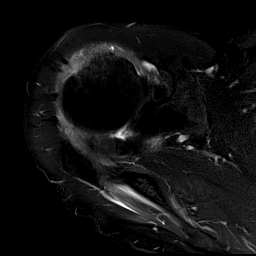
[im 21/26]
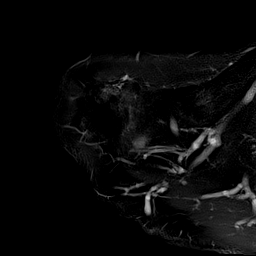
[im 26/26]
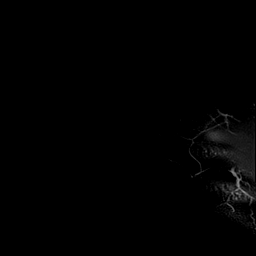

[33 of 40 positions shown; findings below may reference images not displayed]

FINDINGS: Rotator cuff: Full-thickness, full width tears of the supraspinatus
and infraspinatus tendons with 4.5 cm retraction to the medial
humeral head. Moderate subscapularis tendinosis with small focal
low-grade partial-thickness articular surface tear inferiorly at the
insertion. The teres minor tendon is unremarkable.

Muscles: Prominent edema within the infraspinatus muscle. Mild fatty
infiltration of the supraspinatus, infraspinatus, and subscapularis
muscles without frank atrophy.

Biceps long head: Intact and normally positioned. Mild
intra-articular tendinosis.

Acromioclavicular Joint: Moderate arthropathy of the
acromioclavicular joint. Type II acromion. Small amount of fluid in
the subacromial/subdeltoid bursa.

Glenohumeral Joint: Small joint effusion.  No chondral defect.

Labrum: Superior and posterior labral degeneration. No discrete
tear.

Bones: High-riding humeral head. No acute fracture or dislocation.
No suspicious bone lesion.

Other: None.
IMPRESSION: 1. Full-thickness, full width tears of the supraspinatus and
infraspinatus tendons with 4.5 cm retraction. Mild fatty
infiltration of both muscles without frank atrophy.
2. Severe infraspinatus muscle strain.
3. Moderate subscapularis tendinosis with small focal low-grade
partial-thickness articular surface tear inferiorly at the
insertion.
4. Moderate acromioclavicular osteoarthritis.

## 2023-05-07 ENCOUNTER — Other Ambulatory Visit: Payer: Self-pay | Admitting: Internal Medicine

## 2023-05-07 DIAGNOSIS — G300 Alzheimer's disease with early onset: Secondary | ICD-10-CM

## 2023-05-07 DIAGNOSIS — F17210 Nicotine dependence, cigarettes, uncomplicated: Secondary | ICD-10-CM

## 2023-05-14 ENCOUNTER — Ambulatory Visit
Admission: RE | Admit: 2023-05-14 | Discharge: 2023-05-14 | Disposition: A | Discharge status: Home / Self Care | Payer: Medicare Other | Source: Ambulatory Visit | Attending: Internal Medicine | Admitting: Internal Medicine

## 2023-05-14 DIAGNOSIS — F02B0 Dementia in other diseases classified elsewhere, moderate, without behavioral disturbance, psychotic disturbance, mood disturbance, and anxiety: Secondary | ICD-10-CM | POA: Insufficient documentation

## 2023-05-14 DIAGNOSIS — G300 Alzheimer's disease with early onset: Secondary | ICD-10-CM | POA: Insufficient documentation

## 2023-05-14 DIAGNOSIS — F17210 Nicotine dependence, cigarettes, uncomplicated: Secondary | ICD-10-CM | POA: Diagnosis present

## 2023-07-07 ENCOUNTER — Other Ambulatory Visit: Payer: Self-pay

## 2023-07-07 ENCOUNTER — Emergency Department
Admission: EM | Admit: 2023-07-07 | Discharge: 2023-07-07 | Disposition: A | Discharge status: Home / Self Care | Payer: Medicare PPO | Attending: Emergency Medicine | Admitting: Emergency Medicine

## 2023-07-07 ENCOUNTER — Emergency Department: Payer: Medicare PPO

## 2023-07-07 DIAGNOSIS — Z23 Encounter for immunization: Secondary | ICD-10-CM | POA: Diagnosis not present

## 2023-07-07 DIAGNOSIS — S0181XA Laceration without foreign body of other part of head, initial encounter: Secondary | ICD-10-CM | POA: Insufficient documentation

## 2023-07-07 DIAGNOSIS — S0990XA Unspecified injury of head, initial encounter: Secondary | ICD-10-CM | POA: Diagnosis present

## 2023-07-07 DIAGNOSIS — W2203XA Walked into furniture, initial encounter: Secondary | ICD-10-CM | POA: Insufficient documentation

## 2023-07-07 MED ORDER — LIDOCAINE-EPINEPHRINE-TETRACAINE (LET) TOPICAL GEL
3.0000 mL | Freq: Once | TOPICAL | Status: AC
  Administered 2023-07-07: 3 mL via TOPICAL
  Filled 2023-07-07: qty 3

## 2023-07-07 MED ORDER — TETANUS-DIPHTH-ACELL PERTUSSIS 5-2.5-18.5 LF-MCG/0.5 IM SUSY
0.5000 mL | PREFILLED_SYRINGE | Freq: Once | INTRAMUSCULAR | Status: AC
  Administered 2023-07-07: 0.5 mL via INTRAMUSCULAR
  Filled 2023-07-07: qty 0.5

## 2023-07-07 MED ORDER — LIDOCAINE HCL (PF) 1 % IJ SOLN
5.0000 mL | Freq: Once | INTRAMUSCULAR | Status: AC
  Administered 2023-07-07: 5 mL via INTRADERMAL
  Filled 2023-07-07: qty 5

## 2023-07-07 NOTE — ED Provider Notes (Signed)
[facility] Provider Note    Event Date/Time   First MD Initiated Contact with Patient 07/07/23 [phone]     (approximate)   History   Fall and Laceration   HPI  Chad Mann is a 73 y.o. male with history [place] presents emergency department with wife.  Wife states she heard him get up and fall.  Hit his head on the dresser.  No LOC.  Is at baseline.  Takes aspirin a day but not a blood thinner.  Unsure of his last Tdap.  States he has large laceration across forehead      Physical Exam   Triage Vital Signs: ED Triage Vitals  Encounter Vitals Group     BP 07/07/23 0723 (!) 148/100     Systolic BP Percentile --      Diastolic BP Percentile --      Pulse Rate 07/07/23 0723 81     Resp 07/07/23 0723 18     Temp 07/07/23 0723 97.7 F (36.5 C)     Temp Source 07/07/23 0723 Oral     SpO2 07/07/23 0723 97 %     Weight 07/07/23 0724 164 lb (74.4 kg)     Height 07/07/23 0724 6\' 3"  (1.905 m)     Head Circumference --      Peak Flow --      Pain Score 07/07/23 0724 0     Pain Loc --      Pain Education --      Exclude from Growth Chart --     Most recent vital signs: Vitals:   07/07/23 0723  BP: (!) 148/100  Pulse: 81  Resp: 18  Temp: 97.7 F (36.5 C)  SpO2: 97%     General: Awake, no distress.   CV:  Good peripheral perfusion. regular rate and  rhythm Resp:  Normal effort. Lungs CTA Abd:  No distention.   Other:     ED Results / Procedures / Treatments   Labs (all labs ordered are listed, but only abnormal results are displayed) Labs Reviewed - No data to display   EKG     RADIOLOGY CT of the head and cervical    PROCEDURES:   .Laceration Repair  Date/Time: 07/07/2023 9:05 AM  Performed by: Faythe Ghee, PA-C Authorized by: Faythe Ghee, PA-C   Consent:    Consent obtained:  Verbal   Consent given by:  Patient   Risks, benefits, and alternatives were discussed: yes     Risks discussed:  Infection, pain,  retained foreign body, tendon damage, poor cosmetic result, need for additional repair, nerve damage, poor wound healing and vascular damage   Alternatives discussed:  No treatment Universal protocol:    Procedure explained and questions answered to patient or proxy's satisfaction: yes     Immediately prior to procedure, a time out was called: yes     Patient identity confirmed:  Verbally with patient Anesthesia:    Anesthesia method:  Topical application and local infiltration   Topical anesthetic:  LET   Local anesthetic:  Lidocaine 1% w/o epi Laceration details:    Location:  Face   Face location:  Forehead   Length (cm):  4 Pre-procedure details:    Preparation:  Patient was prepped and draped in usual sterile fashion and imaging obtained to evaluate for foreign bodies Exploration:    Hemostasis achieved with:  LET   Imaging outcome: foreign body not noted     Wound  exploration: wound explored through full range of motion and entire depth of wound visualized     Wound extent: areolar tissue not violated, fascia not violated, no foreign body, no signs of injury, no nerve damage, no tendon damage, no underlying fracture and no vascular damage     Contaminated: no   Treatment:    Area cleansed with:  Povidone-iodine and saline   Amount of cleaning:  Standard   Irrigation solution:  Sterile saline   Irrigation method:  Tap   Debridement:  None   Undermining:  None   Scar revision: no     Layers/structures repaired:  Deep dermal/superficial fascia Deep dermal/superficial fascia:    Suture size:  5-0   Suture material:  Vicryl   Suture technique:  Simple interrupted   Number of sutures:  2 Skin repair:    Repair method:  Sutures   Suture size:  5-0   Suture material:  Nylon   Suture technique:  Simple interrupted   Number of sutures:  7 Approximation:    Approximation:  Close Repair type:    Repair type:  Intermediate Post-procedure details:    Dressing:  Non-adherent  dressing   Procedure completion:  Tolerated well, no immediate complications Comments:     Performed by myself and [employer] PA student Amil Amen under my supervision    MEDICATIONS ORDERED IN ED: Medications  lidocaine-EPINEPHrine-tetracaine (LET) topical gel (3 mLs Topical Given 07/07/23 0753)  lidocaine (PF) (XYLOCAINE) 1 % injection 5 mL (5 mLs Intradermal Given 07/07/23 0753)  Tdap (BOOSTRIX) injection 0.5 mL (0.5 mLs Intramuscular Given 07/07/23 0753)     IMPRESSION / MDM / ASSESSMENT AND PLAN / ED COURSE  I reviewed the triage vital signs and the nursing notes.                              Differential diagnosis includes, but is not limited to, concussion, contusion, subdural, SAH, laceration, fall, C-spine fracture, C-spine spine strain  Patient's presentation is most consistent with acute illness / injury with system symptoms.   CT of the head will spine, independently reviewed interpreted by me as being negative for any acute abnormality  See procedure note for laceration repair   Suture care was discussed with patient and his wife.  CT results explained.  Nursing staff will apply nonstick dressing.  Patient is discharged in stable condition.  Sutures should be removed in 7 to 10 days with his primary care doctor or urgent care.  They are in agreement treatment plan.  Discharged in stable condition.   FINAL CLINICAL IMPRESSION(S) / ED DIAGNOSES   Final diagnoses:  Injury of head, initial encounter  Facial laceration, initial encounter     Rx / DC Orders   ED Discharge Orders     None        Note:  This document was prepared using Dragon voice recognition software and may include unintentional dictation errors.    Faythe Ghee, PA-C 07/07/23 [id number]    Jene Every, MD 07/07/23 [phone]

## 2023-07-07 NOTE — Discharge Instructions (Addendum)
Your CT of your head and cervical spine are negative for any acute abnormalities  Sutures should be removed in 7 to 10 days.  Please see your primary care doctor or urgent care as the ER will give you another ER charge for suture removal.  If there is any sign of infection you may go to your regular doctor return the emergency department.

## 2023-07-07 NOTE — ED Triage Notes (Signed)
Pt presents with lac to L side of head due from tripping and hitting bed, pt denies LOC. NAD noted. Pt is A&Ox4.

## 2023-07-07 NOTE — ED Notes (Signed)
Pt states he fell this morning. Pt denies any thinners. Pt has laceration to top of forehead. Bleeding controlled.

## 2023-07-25 ENCOUNTER — Other Ambulatory Visit: Payer: Self-pay | Admitting: Student

## 2023-07-25 DIAGNOSIS — R569 Unspecified convulsions: Secondary | ICD-10-CM

## 2023-07-25 DIAGNOSIS — S0990XD Unspecified injury of head, subsequent encounter: Secondary | ICD-10-CM

## 2023-07-25 DIAGNOSIS — G301 Alzheimer's disease with late onset: Secondary | ICD-10-CM

## 2023-09-24 ENCOUNTER — Other Ambulatory Visit: Payer: Self-pay | Admitting: Student

## 2023-09-24 DIAGNOSIS — R569 Unspecified convulsions: Secondary | ICD-10-CM

## 2023-09-24 DIAGNOSIS — G301 Alzheimer's disease with late onset: Secondary | ICD-10-CM

## 2023-09-30 ENCOUNTER — Ambulatory Visit
Admission: RE | Admit: 2023-09-30 | Discharge: 2023-09-30 | Disposition: A | Discharge status: Home / Self Care | Source: Ambulatory Visit | Attending: Student | Admitting: Student

## 2023-09-30 DIAGNOSIS — R569 Unspecified convulsions: Secondary | ICD-10-CM | POA: Diagnosis present

## 2023-09-30 DIAGNOSIS — G301 Alzheimer's disease with late onset: Secondary | ICD-10-CM

## 2023-09-30 MED ORDER — GADOBUTROL 1 MMOL/ML IV SOLN
7.5000 mL | Freq: Once | INTRAVENOUS | Status: AC | PRN
  Administered 2023-09-30: 7.5 mL via INTRAVENOUS

## 2024-05-20 ENCOUNTER — Other Ambulatory Visit: Payer: Self-pay | Admitting: Internal Medicine

## 2024-05-20 DIAGNOSIS — Z720 Tobacco use: Secondary | ICD-10-CM

## 2024-05-20 DIAGNOSIS — R0609 Other forms of dyspnea: Secondary | ICD-10-CM

## 2024-05-20 DIAGNOSIS — D500 Iron deficiency anemia secondary to blood loss (chronic): Secondary | ICD-10-CM

## 2024-05-26 ENCOUNTER — Ambulatory Visit: Admission: RE | Admit: 2024-05-26 | Discharge: 2024-05-26 | Attending: Internal Medicine | Admitting: Internal Medicine

## 2024-05-26 DIAGNOSIS — R0609 Other forms of dyspnea: Secondary | ICD-10-CM | POA: Diagnosis present

## 2024-05-26 DIAGNOSIS — Z720 Tobacco use: Secondary | ICD-10-CM | POA: Insufficient documentation

## 2024-05-26 DIAGNOSIS — D500 Iron deficiency anemia secondary to blood loss (chronic): Secondary | ICD-10-CM | POA: Diagnosis present

## 2024-05-26 MED ORDER — IOHEXOL 300 MG/ML  SOLN
80.0000 mL | Freq: Once | INTRAMUSCULAR | Status: AC | PRN
  Administered 2024-05-26: 80 mL via INTRAVENOUS
